# Patient Record
Sex: Female | Born: 1945 | Race: White | Hispanic: No | Smoking: Never smoker
Health system: Southern US, Community
[De-identification: ages and names within clinical notes are randomized; demographics above are authoritative.]

## PROBLEM LIST (undated history)

## (undated) DIAGNOSIS — E079 Disorder of thyroid, unspecified: Secondary | ICD-10-CM

## (undated) DIAGNOSIS — K8020 Calculus of gallbladder without cholecystitis without obstruction: Secondary | ICD-10-CM

## (undated) DIAGNOSIS — H409 Unspecified glaucoma: Secondary | ICD-10-CM

## (undated) HISTORY — PX: HYSTEROTOMY: SHX1776

## (undated) HISTORY — DX: Calculus of gallbladder without cholecystitis without obstruction: K80.20

## (undated) HISTORY — PX: HERNIA REPAIR: SHX51

## (undated) HISTORY — PX: CHOLECYSTECTOMY: SHX55

## (undated) HISTORY — DX: Unspecified glaucoma: H40.9

## (undated) HISTORY — DX: Disorder of thyroid, unspecified: E07.9

---

## 1976-06-15 DIAGNOSIS — N200 Calculus of kidney: Secondary | ICD-10-CM

## 1976-06-15 HISTORY — DX: Calculus of kidney: N20.0

## 2005-06-15 DIAGNOSIS — C4490 Unspecified malignant neoplasm of skin, unspecified: Secondary | ICD-10-CM

## 2005-06-15 HISTORY — DX: Unspecified malignant neoplasm of skin, unspecified: C44.90

## 2014-08-30 DIAGNOSIS — F070 Personality change due to known physiological condition: Secondary | ICD-10-CM | POA: Diagnosis not present

## 2014-08-30 DIAGNOSIS — E785 Hyperlipidemia, unspecified: Secondary | ICD-10-CM | POA: Diagnosis not present

## 2014-08-30 DIAGNOSIS — K432 Incisional hernia without obstruction or gangrene: Secondary | ICD-10-CM | POA: Diagnosis not present

## 2014-08-30 DIAGNOSIS — E039 Hypothyroidism, unspecified: Secondary | ICD-10-CM | POA: Diagnosis not present

## 2014-08-30 DIAGNOSIS — H9190 Unspecified hearing loss, unspecified ear: Secondary | ICD-10-CM | POA: Diagnosis not present

## 2014-08-30 DIAGNOSIS — Z89119 Acquired absence of unspecified hand: Secondary | ICD-10-CM | POA: Diagnosis not present

## 2014-09-05 DIAGNOSIS — K5730 Diverticulosis of large intestine without perforation or abscess without bleeding: Secondary | ICD-10-CM | POA: Diagnosis not present

## 2014-09-05 DIAGNOSIS — Z86010 Personal history of colonic polyps: Secondary | ICD-10-CM | POA: Diagnosis not present

## 2014-09-05 DIAGNOSIS — D125 Benign neoplasm of sigmoid colon: Secondary | ICD-10-CM | POA: Diagnosis not present

## 2014-09-05 DIAGNOSIS — K5790 Diverticulosis of intestine, part unspecified, without perforation or abscess without bleeding: Secondary | ICD-10-CM | POA: Diagnosis not present

## 2014-09-24 DIAGNOSIS — Z85828 Personal history of other malignant neoplasm of skin: Secondary | ICD-10-CM | POA: Diagnosis not present

## 2014-09-24 DIAGNOSIS — Z08 Encounter for follow-up examination after completed treatment for malignant neoplasm: Secondary | ICD-10-CM | POA: Diagnosis not present

## 2014-09-24 DIAGNOSIS — L821 Other seborrheic keratosis: Secondary | ICD-10-CM | POA: Diagnosis not present

## 2014-09-24 DIAGNOSIS — D225 Melanocytic nevi of trunk: Secondary | ICD-10-CM | POA: Diagnosis not present

## 2014-10-15 DIAGNOSIS — K432 Incisional hernia without obstruction or gangrene: Secondary | ICD-10-CM | POA: Diagnosis not present

## 2014-10-15 DIAGNOSIS — E039 Hypothyroidism, unspecified: Secondary | ICD-10-CM | POA: Diagnosis not present

## 2014-10-15 DIAGNOSIS — E785 Hyperlipidemia, unspecified: Secondary | ICD-10-CM | POA: Diagnosis not present

## 2014-10-15 DIAGNOSIS — F070 Personality change due to known physiological condition: Secondary | ICD-10-CM | POA: Diagnosis not present

## 2014-10-15 DIAGNOSIS — Z89119 Acquired absence of unspecified hand: Secondary | ICD-10-CM | POA: Diagnosis not present

## 2014-10-15 DIAGNOSIS — H9190 Unspecified hearing loss, unspecified ear: Secondary | ICD-10-CM | POA: Diagnosis not present

## 2014-12-19 DIAGNOSIS — E785 Hyperlipidemia, unspecified: Secondary | ICD-10-CM | POA: Diagnosis not present

## 2014-12-19 DIAGNOSIS — H9190 Unspecified hearing loss, unspecified ear: Secondary | ICD-10-CM | POA: Diagnosis not present

## 2014-12-19 DIAGNOSIS — K432 Incisional hernia without obstruction or gangrene: Secondary | ICD-10-CM | POA: Diagnosis not present

## 2014-12-19 DIAGNOSIS — F070 Personality change due to known physiological condition: Secondary | ICD-10-CM | POA: Diagnosis not present

## 2014-12-19 DIAGNOSIS — B070 Plantar wart: Secondary | ICD-10-CM | POA: Diagnosis not present

## 2014-12-19 DIAGNOSIS — E039 Hypothyroidism, unspecified: Secondary | ICD-10-CM | POA: Diagnosis not present

## 2014-12-19 DIAGNOSIS — Z89119 Acquired absence of unspecified hand: Secondary | ICD-10-CM | POA: Diagnosis not present

## 2015-03-29 DIAGNOSIS — L989 Disorder of the skin and subcutaneous tissue, unspecified: Secondary | ICD-10-CM | POA: Diagnosis not present

## 2015-05-27 DIAGNOSIS — M7751 Other enthesopathy of right foot: Secondary | ICD-10-CM | POA: Diagnosis not present

## 2015-05-27 DIAGNOSIS — L989 Disorder of the skin and subcutaneous tissue, unspecified: Secondary | ICD-10-CM | POA: Diagnosis not present

## 2015-05-27 DIAGNOSIS — M7741 Metatarsalgia, right foot: Secondary | ICD-10-CM | POA: Diagnosis not present

## 2015-06-19 DIAGNOSIS — M7751 Other enthesopathy of right foot: Secondary | ICD-10-CM | POA: Diagnosis not present

## 2015-06-19 DIAGNOSIS — M7741 Metatarsalgia, right foot: Secondary | ICD-10-CM | POA: Diagnosis not present

## 2015-06-19 DIAGNOSIS — L989 Disorder of the skin and subcutaneous tissue, unspecified: Secondary | ICD-10-CM | POA: Diagnosis not present

## 2015-07-01 DIAGNOSIS — Z89119 Acquired absence of unspecified hand: Secondary | ICD-10-CM | POA: Diagnosis not present

## 2015-07-01 DIAGNOSIS — B070 Plantar wart: Secondary | ICD-10-CM | POA: Diagnosis not present

## 2015-07-01 DIAGNOSIS — H9190 Unspecified hearing loss, unspecified ear: Secondary | ICD-10-CM | POA: Diagnosis not present

## 2015-07-01 DIAGNOSIS — E785 Hyperlipidemia, unspecified: Secondary | ICD-10-CM | POA: Diagnosis not present

## 2015-07-01 DIAGNOSIS — K432 Incisional hernia without obstruction or gangrene: Secondary | ICD-10-CM | POA: Diagnosis not present

## 2015-07-01 DIAGNOSIS — E039 Hypothyroidism, unspecified: Secondary | ICD-10-CM | POA: Diagnosis not present

## 2015-07-01 DIAGNOSIS — E559 Vitamin D deficiency, unspecified: Secondary | ICD-10-CM | POA: Diagnosis not present

## 2015-09-04 DIAGNOSIS — Z89119 Acquired absence of unspecified hand: Secondary | ICD-10-CM | POA: Diagnosis not present

## 2015-09-04 DIAGNOSIS — K432 Incisional hernia without obstruction or gangrene: Secondary | ICD-10-CM | POA: Diagnosis not present

## 2015-09-04 DIAGNOSIS — E559 Vitamin D deficiency, unspecified: Secondary | ICD-10-CM | POA: Diagnosis not present

## 2015-09-04 DIAGNOSIS — H9190 Unspecified hearing loss, unspecified ear: Secondary | ICD-10-CM | POA: Diagnosis not present

## 2015-09-04 DIAGNOSIS — E039 Hypothyroidism, unspecified: Secondary | ICD-10-CM | POA: Diagnosis not present

## 2015-09-04 DIAGNOSIS — E785 Hyperlipidemia, unspecified: Secondary | ICD-10-CM | POA: Diagnosis not present

## 2015-09-04 DIAGNOSIS — B070 Plantar wart: Secondary | ICD-10-CM | POA: Diagnosis not present

## 2015-12-25 DIAGNOSIS — M795 Residual foreign body in soft tissue: Secondary | ICD-10-CM | POA: Diagnosis not present

## 2016-01-08 DIAGNOSIS — H2513 Age-related nuclear cataract, bilateral: Secondary | ICD-10-CM | POA: Diagnosis not present

## 2016-01-08 DIAGNOSIS — H40053 Ocular hypertension, bilateral: Secondary | ICD-10-CM | POA: Diagnosis not present

## 2016-01-27 DIAGNOSIS — H40053 Ocular hypertension, bilateral: Secondary | ICD-10-CM | POA: Diagnosis not present

## 2016-04-01 DIAGNOSIS — E785 Hyperlipidemia, unspecified: Secondary | ICD-10-CM | POA: Diagnosis not present

## 2016-04-01 DIAGNOSIS — L989 Disorder of the skin and subcutaneous tissue, unspecified: Secondary | ICD-10-CM | POA: Diagnosis not present

## 2016-04-01 DIAGNOSIS — Z79899 Other long term (current) drug therapy: Secondary | ICD-10-CM | POA: Diagnosis not present

## 2016-04-01 DIAGNOSIS — E039 Hypothyroidism, unspecified: Secondary | ICD-10-CM | POA: Diagnosis not present

## 2016-04-01 DIAGNOSIS — J40 Bronchitis, not specified as acute or chronic: Secondary | ICD-10-CM | POA: Diagnosis not present

## 2016-04-13 DIAGNOSIS — Z1231 Encounter for screening mammogram for malignant neoplasm of breast: Secondary | ICD-10-CM | POA: Diagnosis not present

## 2016-04-13 DIAGNOSIS — R921 Mammographic calcification found on diagnostic imaging of breast: Secondary | ICD-10-CM | POA: Diagnosis not present

## 2016-04-20 DIAGNOSIS — Z0000 Encounter for general adult medical examination without abnormal findings: Secondary | ICD-10-CM | POA: Diagnosis not present

## 2016-04-20 DIAGNOSIS — J209 Acute bronchitis, unspecified: Secondary | ICD-10-CM | POA: Diagnosis not present

## 2016-04-25 DIAGNOSIS — R05 Cough: Secondary | ICD-10-CM | POA: Diagnosis not present

## 2016-04-25 DIAGNOSIS — R0600 Dyspnea, unspecified: Secondary | ICD-10-CM | POA: Diagnosis not present

## 2016-05-11 DIAGNOSIS — J40 Bronchitis, not specified as acute or chronic: Secondary | ICD-10-CM | POA: Diagnosis not present

## 2016-05-11 DIAGNOSIS — Z23 Encounter for immunization: Secondary | ICD-10-CM | POA: Diagnosis not present

## 2016-05-11 DIAGNOSIS — R05 Cough: Secondary | ICD-10-CM | POA: Diagnosis not present

## 2016-08-26 DIAGNOSIS — E785 Hyperlipidemia, unspecified: Secondary | ICD-10-CM | POA: Diagnosis not present

## 2016-08-26 DIAGNOSIS — J40 Bronchitis, not specified as acute or chronic: Secondary | ICD-10-CM | POA: Diagnosis not present

## 2016-08-26 DIAGNOSIS — E039 Hypothyroidism, unspecified: Secondary | ICD-10-CM | POA: Diagnosis not present

## 2016-09-06 DIAGNOSIS — R062 Wheezing: Secondary | ICD-10-CM | POA: Diagnosis not present

## 2016-09-06 DIAGNOSIS — J209 Acute bronchitis, unspecified: Secondary | ICD-10-CM | POA: Diagnosis not present

## 2017-01-11 DIAGNOSIS — H40012 Open angle with borderline findings, low risk, left eye: Secondary | ICD-10-CM | POA: Diagnosis not present

## 2017-01-11 DIAGNOSIS — H40011 Open angle with borderline findings, low risk, right eye: Secondary | ICD-10-CM | POA: Diagnosis not present

## 2017-01-11 DIAGNOSIS — H25813 Combined forms of age-related cataract, bilateral: Secondary | ICD-10-CM | POA: Diagnosis not present

## 2017-03-08 DIAGNOSIS — Z79899 Other long term (current) drug therapy: Secondary | ICD-10-CM | POA: Diagnosis not present

## 2017-03-08 DIAGNOSIS — E039 Hypothyroidism, unspecified: Secondary | ICD-10-CM | POA: Diagnosis not present

## 2017-03-08 DIAGNOSIS — Z23 Encounter for immunization: Secondary | ICD-10-CM | POA: Diagnosis not present

## 2017-03-08 DIAGNOSIS — E785 Hyperlipidemia, unspecified: Secondary | ICD-10-CM | POA: Diagnosis not present

## 2017-03-08 DIAGNOSIS — Z6825 Body mass index (BMI) 25.0-25.9, adult: Secondary | ICD-10-CM | POA: Diagnosis not present

## 2017-03-10 DIAGNOSIS — H40011 Open angle with borderline findings, low risk, right eye: Secondary | ICD-10-CM | POA: Diagnosis not present

## 2017-03-10 DIAGNOSIS — H40012 Open angle with borderline findings, low risk, left eye: Secondary | ICD-10-CM | POA: Diagnosis not present

## 2017-03-22 DIAGNOSIS — J0100 Acute maxillary sinusitis, unspecified: Secondary | ICD-10-CM | POA: Diagnosis not present

## 2017-03-22 DIAGNOSIS — J209 Acute bronchitis, unspecified: Secondary | ICD-10-CM | POA: Diagnosis not present

## 2017-04-01 DIAGNOSIS — H401131 Primary open-angle glaucoma, bilateral, mild stage: Secondary | ICD-10-CM | POA: Diagnosis not present

## 2017-05-17 DIAGNOSIS — R0602 Shortness of breath: Secondary | ICD-10-CM | POA: Diagnosis not present

## 2017-05-17 DIAGNOSIS — R05 Cough: Secondary | ICD-10-CM | POA: Diagnosis not present

## 2017-05-17 DIAGNOSIS — R0600 Dyspnea, unspecified: Secondary | ICD-10-CM | POA: Diagnosis not present

## 2017-05-18 DIAGNOSIS — Z1231 Encounter for screening mammogram for malignant neoplasm of breast: Secondary | ICD-10-CM | POA: Diagnosis not present

## 2017-07-02 DIAGNOSIS — H401131 Primary open-angle glaucoma, bilateral, mild stage: Secondary | ICD-10-CM | POA: Diagnosis not present

## 2017-08-01 DIAGNOSIS — J209 Acute bronchitis, unspecified: Secondary | ICD-10-CM | POA: Diagnosis not present

## 2017-08-24 DIAGNOSIS — J40 Bronchitis, not specified as acute or chronic: Secondary | ICD-10-CM | POA: Diagnosis not present

## 2017-08-24 DIAGNOSIS — E039 Hypothyroidism, unspecified: Secondary | ICD-10-CM | POA: Diagnosis not present

## 2017-08-24 DIAGNOSIS — Z0000 Encounter for general adult medical examination without abnormal findings: Secondary | ICD-10-CM | POA: Diagnosis not present

## 2017-08-24 DIAGNOSIS — E785 Hyperlipidemia, unspecified: Secondary | ICD-10-CM | POA: Diagnosis not present

## 2017-08-24 DIAGNOSIS — Z79899 Other long term (current) drug therapy: Secondary | ICD-10-CM | POA: Diagnosis not present

## 2017-11-26 DIAGNOSIS — J309 Allergic rhinitis, unspecified: Secondary | ICD-10-CM | POA: Diagnosis not present

## 2017-11-26 DIAGNOSIS — R062 Wheezing: Secondary | ICD-10-CM | POA: Diagnosis not present

## 2017-11-26 DIAGNOSIS — J206 Acute bronchitis due to rhinovirus: Secondary | ICD-10-CM | POA: Diagnosis not present

## 2018-03-10 DIAGNOSIS — E785 Hyperlipidemia, unspecified: Secondary | ICD-10-CM | POA: Diagnosis not present

## 2018-03-10 DIAGNOSIS — Z6825 Body mass index (BMI) 25.0-25.9, adult: Secondary | ICD-10-CM | POA: Diagnosis not present

## 2018-03-10 DIAGNOSIS — E039 Hypothyroidism, unspecified: Secondary | ICD-10-CM | POA: Diagnosis not present

## 2018-03-10 DIAGNOSIS — Z79899 Other long term (current) drug therapy: Secondary | ICD-10-CM | POA: Diagnosis not present

## 2018-03-10 DIAGNOSIS — Z23 Encounter for immunization: Secondary | ICD-10-CM | POA: Diagnosis not present

## 2018-03-29 DIAGNOSIS — J209 Acute bronchitis, unspecified: Secondary | ICD-10-CM | POA: Diagnosis not present

## 2018-03-29 DIAGNOSIS — J309 Allergic rhinitis, unspecified: Secondary | ICD-10-CM | POA: Diagnosis not present

## 2018-05-17 DIAGNOSIS — L821 Other seborrheic keratosis: Secondary | ICD-10-CM | POA: Diagnosis not present

## 2018-05-17 DIAGNOSIS — L570 Actinic keratosis: Secondary | ICD-10-CM | POA: Diagnosis not present

## 2018-06-17 DIAGNOSIS — J209 Acute bronchitis, unspecified: Secondary | ICD-10-CM | POA: Diagnosis not present

## 2018-06-17 DIAGNOSIS — J324 Chronic pansinusitis: Secondary | ICD-10-CM | POA: Diagnosis not present

## 2018-07-11 DIAGNOSIS — Z1231 Encounter for screening mammogram for malignant neoplasm of breast: Secondary | ICD-10-CM | POA: Diagnosis not present

## 2018-07-20 DIAGNOSIS — L820 Inflamed seborrheic keratosis: Secondary | ICD-10-CM | POA: Diagnosis not present

## 2018-07-20 DIAGNOSIS — L570 Actinic keratosis: Secondary | ICD-10-CM | POA: Diagnosis not present

## 2018-09-21 DIAGNOSIS — J309 Allergic rhinitis, unspecified: Secondary | ICD-10-CM | POA: Diagnosis not present

## 2018-09-21 DIAGNOSIS — J449 Chronic obstructive pulmonary disease, unspecified: Secondary | ICD-10-CM | POA: Diagnosis not present

## 2018-12-13 DIAGNOSIS — J189 Pneumonia, unspecified organism: Secondary | ICD-10-CM | POA: Diagnosis not present

## 2018-12-13 DIAGNOSIS — J449 Chronic obstructive pulmonary disease, unspecified: Secondary | ICD-10-CM | POA: Diagnosis not present

## 2018-12-13 DIAGNOSIS — R9431 Abnormal electrocardiogram [ECG] [EKG]: Secondary | ICD-10-CM | POA: Diagnosis not present

## 2018-12-26 DIAGNOSIS — J449 Chronic obstructive pulmonary disease, unspecified: Secondary | ICD-10-CM | POA: Diagnosis not present

## 2018-12-26 DIAGNOSIS — J209 Acute bronchitis, unspecified: Secondary | ICD-10-CM | POA: Diagnosis not present

## 2018-12-26 DIAGNOSIS — R0602 Shortness of breath: Secondary | ICD-10-CM | POA: Diagnosis not present

## 2019-01-12 ENCOUNTER — Other Ambulatory Visit: Payer: Self-pay

## 2019-03-06 DIAGNOSIS — K449 Diaphragmatic hernia without obstruction or gangrene: Secondary | ICD-10-CM | POA: Diagnosis not present

## 2019-03-06 DIAGNOSIS — R319 Hematuria, unspecified: Secondary | ICD-10-CM | POA: Diagnosis not present

## 2019-03-06 DIAGNOSIS — K5732 Diverticulitis of large intestine without perforation or abscess without bleeding: Secondary | ICD-10-CM | POA: Diagnosis not present

## 2019-03-06 DIAGNOSIS — R1032 Left lower quadrant pain: Secondary | ICD-10-CM | POA: Diagnosis not present

## 2019-03-06 DIAGNOSIS — R1084 Generalized abdominal pain: Secondary | ICD-10-CM | POA: Diagnosis not present

## 2019-03-13 DIAGNOSIS — E785 Hyperlipidemia, unspecified: Secondary | ICD-10-CM | POA: Diagnosis not present

## 2019-03-13 DIAGNOSIS — Z6825 Body mass index (BMI) 25.0-25.9, adult: Secondary | ICD-10-CM | POA: Diagnosis not present

## 2019-03-13 DIAGNOSIS — E039 Hypothyroidism, unspecified: Secondary | ICD-10-CM | POA: Diagnosis not present

## 2019-03-13 DIAGNOSIS — Z79899 Other long term (current) drug therapy: Secondary | ICD-10-CM | POA: Diagnosis not present

## 2019-03-13 DIAGNOSIS — Z23 Encounter for immunization: Secondary | ICD-10-CM | POA: Diagnosis not present

## 2019-03-13 DIAGNOSIS — Z0000 Encounter for general adult medical examination without abnormal findings: Secondary | ICD-10-CM | POA: Diagnosis not present

## 2019-03-21 DIAGNOSIS — H25812 Combined forms of age-related cataract, left eye: Secondary | ICD-10-CM | POA: Diagnosis not present

## 2019-03-21 DIAGNOSIS — Z01818 Encounter for other preprocedural examination: Secondary | ICD-10-CM | POA: Diagnosis not present

## 2019-03-21 DIAGNOSIS — H401132 Primary open-angle glaucoma, bilateral, moderate stage: Secondary | ICD-10-CM | POA: Diagnosis not present

## 2019-03-24 DIAGNOSIS — H401132 Primary open-angle glaucoma, bilateral, moderate stage: Secondary | ICD-10-CM | POA: Diagnosis not present

## 2019-04-11 DIAGNOSIS — H401122 Primary open-angle glaucoma, left eye, moderate stage: Secondary | ICD-10-CM | POA: Diagnosis not present

## 2019-04-11 DIAGNOSIS — Z79899 Other long term (current) drug therapy: Secondary | ICD-10-CM | POA: Diagnosis not present

## 2019-04-11 DIAGNOSIS — H401120 Primary open-angle glaucoma, left eye, stage unspecified: Secondary | ICD-10-CM | POA: Diagnosis not present

## 2019-04-11 DIAGNOSIS — H2512 Age-related nuclear cataract, left eye: Secondary | ICD-10-CM | POA: Diagnosis not present

## 2019-04-11 DIAGNOSIS — Z87891 Personal history of nicotine dependence: Secondary | ICD-10-CM | POA: Diagnosis not present

## 2019-04-11 DIAGNOSIS — H25812 Combined forms of age-related cataract, left eye: Secondary | ICD-10-CM | POA: Diagnosis not present

## 2019-04-25 DIAGNOSIS — E039 Hypothyroidism, unspecified: Secondary | ICD-10-CM | POA: Diagnosis not present

## 2019-04-25 DIAGNOSIS — H25811 Combined forms of age-related cataract, right eye: Secondary | ICD-10-CM | POA: Diagnosis not present

## 2019-04-25 DIAGNOSIS — H401112 Primary open-angle glaucoma, right eye, moderate stage: Secondary | ICD-10-CM | POA: Diagnosis not present

## 2019-04-25 DIAGNOSIS — H401110 Primary open-angle glaucoma, right eye, stage unspecified: Secondary | ICD-10-CM | POA: Diagnosis not present

## 2019-04-25 DIAGNOSIS — E785 Hyperlipidemia, unspecified: Secondary | ICD-10-CM | POA: Diagnosis not present

## 2019-04-25 DIAGNOSIS — Z87891 Personal history of nicotine dependence: Secondary | ICD-10-CM | POA: Diagnosis not present

## 2019-04-25 DIAGNOSIS — H2511 Age-related nuclear cataract, right eye: Secondary | ICD-10-CM | POA: Diagnosis not present

## 2019-06-29 DIAGNOSIS — J069 Acute upper respiratory infection, unspecified: Secondary | ICD-10-CM | POA: Diagnosis not present

## 2019-07-19 DIAGNOSIS — T148XXA Other injury of unspecified body region, initial encounter: Secondary | ICD-10-CM | POA: Diagnosis not present

## 2019-07-19 DIAGNOSIS — Z6826 Body mass index (BMI) 26.0-26.9, adult: Secondary | ICD-10-CM | POA: Diagnosis not present

## 2019-07-24 DIAGNOSIS — B029 Zoster without complications: Secondary | ICD-10-CM | POA: Diagnosis not present

## 2019-07-26 DIAGNOSIS — H1012 Acute atopic conjunctivitis, left eye: Secondary | ICD-10-CM | POA: Diagnosis not present

## 2019-07-31 DIAGNOSIS — B0229 Other postherpetic nervous system involvement: Secondary | ICD-10-CM | POA: Diagnosis not present

## 2019-07-31 DIAGNOSIS — Z6826 Body mass index (BMI) 26.0-26.9, adult: Secondary | ICD-10-CM | POA: Diagnosis not present

## 2019-07-31 DIAGNOSIS — Z1331 Encounter for screening for depression: Secondary | ICD-10-CM | POA: Diagnosis not present

## 2019-08-18 DIAGNOSIS — H401131 Primary open-angle glaucoma, bilateral, mild stage: Secondary | ICD-10-CM | POA: Diagnosis not present

## 2019-08-28 DIAGNOSIS — J180 Bronchopneumonia, unspecified organism: Secondary | ICD-10-CM | POA: Diagnosis not present

## 2019-09-01 DIAGNOSIS — Z1231 Encounter for screening mammogram for malignant neoplasm of breast: Secondary | ICD-10-CM | POA: Diagnosis not present

## 2019-09-14 DIAGNOSIS — Z23 Encounter for immunization: Secondary | ICD-10-CM | POA: Diagnosis not present

## 2019-10-12 DIAGNOSIS — Z23 Encounter for immunization: Secondary | ICD-10-CM | POA: Diagnosis not present

## 2019-10-28 DIAGNOSIS — R05 Cough: Secondary | ICD-10-CM | POA: Diagnosis not present

## 2019-10-28 DIAGNOSIS — R062 Wheezing: Secondary | ICD-10-CM | POA: Diagnosis not present

## 2020-01-10 DIAGNOSIS — R05 Cough: Secondary | ICD-10-CM | POA: Diagnosis not present

## 2020-01-10 DIAGNOSIS — R0600 Dyspnea, unspecified: Secondary | ICD-10-CM | POA: Diagnosis not present

## 2020-01-10 DIAGNOSIS — R062 Wheezing: Secondary | ICD-10-CM | POA: Diagnosis not present

## 2020-03-15 DIAGNOSIS — E785 Hyperlipidemia, unspecified: Secondary | ICD-10-CM | POA: Diagnosis not present

## 2020-03-15 DIAGNOSIS — E039 Hypothyroidism, unspecified: Secondary | ICD-10-CM | POA: Diagnosis not present

## 2020-03-15 DIAGNOSIS — M8580 Other specified disorders of bone density and structure, unspecified site: Secondary | ICD-10-CM | POA: Diagnosis not present

## 2020-03-15 DIAGNOSIS — Z0000 Encounter for general adult medical examination without abnormal findings: Secondary | ICD-10-CM | POA: Diagnosis not present

## 2020-03-15 DIAGNOSIS — Z6826 Body mass index (BMI) 26.0-26.9, adult: Secondary | ICD-10-CM | POA: Diagnosis not present

## 2020-03-15 DIAGNOSIS — Z23 Encounter for immunization: Secondary | ICD-10-CM | POA: Diagnosis not present

## 2020-03-15 DIAGNOSIS — Z79899 Other long term (current) drug therapy: Secondary | ICD-10-CM | POA: Diagnosis not present

## 2020-04-16 DIAGNOSIS — J069 Acute upper respiratory infection, unspecified: Secondary | ICD-10-CM | POA: Diagnosis not present

## 2020-04-16 DIAGNOSIS — R051 Acute cough: Secondary | ICD-10-CM | POA: Diagnosis not present

## 2020-04-26 DIAGNOSIS — H401131 Primary open-angle glaucoma, bilateral, mild stage: Secondary | ICD-10-CM | POA: Diagnosis not present

## 2020-04-26 DIAGNOSIS — H18413 Arcus senilis, bilateral: Secondary | ICD-10-CM | POA: Diagnosis not present

## 2020-04-26 DIAGNOSIS — H35373 Puckering of macula, bilateral: Secondary | ICD-10-CM | POA: Diagnosis not present

## 2020-04-26 DIAGNOSIS — H02831 Dermatochalasis of right upper eyelid: Secondary | ICD-10-CM | POA: Diagnosis not present

## 2020-05-21 DIAGNOSIS — J40 Bronchitis, not specified as acute or chronic: Secondary | ICD-10-CM | POA: Diagnosis not present

## 2020-05-28 DIAGNOSIS — Z23 Encounter for immunization: Secondary | ICD-10-CM | POA: Diagnosis not present

## 2020-07-22 DIAGNOSIS — J40 Bronchitis, not specified as acute or chronic: Secondary | ICD-10-CM | POA: Diagnosis not present

## 2020-09-09 DIAGNOSIS — J42 Unspecified chronic bronchitis: Secondary | ICD-10-CM | POA: Diagnosis not present

## 2020-09-17 DIAGNOSIS — Z1231 Encounter for screening mammogram for malignant neoplasm of breast: Secondary | ICD-10-CM | POA: Diagnosis not present

## 2020-10-28 DIAGNOSIS — Z23 Encounter for immunization: Secondary | ICD-10-CM | POA: Diagnosis not present

## 2020-10-30 DIAGNOSIS — H401131 Primary open-angle glaucoma, bilateral, mild stage: Secondary | ICD-10-CM | POA: Diagnosis not present

## 2020-12-08 DIAGNOSIS — B021 Zoster meningitis: Secondary | ICD-10-CM | POA: Diagnosis not present

## 2020-12-10 DIAGNOSIS — K5790 Diverticulosis of intestine, part unspecified, without perforation or abscess without bleeding: Secondary | ICD-10-CM

## 2020-12-10 HISTORY — DX: Diverticulosis of intestine, part unspecified, without perforation or abscess without bleeding: K57.90

## 2020-12-11 DIAGNOSIS — N135 Crossing vessel and stricture of ureter without hydronephrosis: Secondary | ICD-10-CM | POA: Diagnosis not present

## 2020-12-11 DIAGNOSIS — R109 Unspecified abdominal pain: Secondary | ICD-10-CM | POA: Diagnosis not present

## 2020-12-12 DIAGNOSIS — Z6826 Body mass index (BMI) 26.0-26.9, adult: Secondary | ICD-10-CM | POA: Diagnosis not present

## 2020-12-12 DIAGNOSIS — N135 Crossing vessel and stricture of ureter without hydronephrosis: Secondary | ICD-10-CM | POA: Diagnosis not present

## 2020-12-19 ENCOUNTER — Other Ambulatory Visit (HOSPITAL_COMMUNITY): Payer: Self-pay | Admitting: Urology

## 2020-12-19 ENCOUNTER — Other Ambulatory Visit: Payer: Self-pay | Admitting: Urology

## 2020-12-19 DIAGNOSIS — N130 Hydronephrosis with ureteropelvic junction obstruction: Secondary | ICD-10-CM | POA: Diagnosis not present

## 2020-12-19 DIAGNOSIS — Q6211 Congenital occlusion of ureteropelvic junction: Secondary | ICD-10-CM | POA: Diagnosis not present

## 2021-01-02 ENCOUNTER — Ambulatory Visit (HOSPITAL_COMMUNITY)
Admission: RE | Admit: 2021-01-02 | Discharge: 2021-01-02 | Disposition: A | Discharge status: Home / Self Care | Payer: Medicare Other | Source: Ambulatory Visit | Attending: Urology | Admitting: Urology

## 2021-01-02 ENCOUNTER — Other Ambulatory Visit: Payer: Self-pay

## 2021-01-02 DIAGNOSIS — N130 Hydronephrosis with ureteropelvic junction obstruction: Secondary | ICD-10-CM

## 2021-01-02 DIAGNOSIS — Q6211 Congenital occlusion of ureteropelvic junction: Secondary | ICD-10-CM | POA: Diagnosis not present

## 2021-01-02 DIAGNOSIS — N1330 Unspecified hydronephrosis: Secondary | ICD-10-CM | POA: Diagnosis not present

## 2021-01-02 MED ORDER — FUROSEMIDE 10 MG/ML IJ SOLN: 33.0000 mg | Freq: Once | INTRAMUSCULAR | Status: AC

## 2021-01-02 MED ORDER — FUROSEMIDE 10 MG/ML IJ SOLN
INTRAMUSCULAR | Status: AC
  Administered 2021-01-02: 33 mg via INTRAVENOUS
  Filled 2021-01-02: qty 4

## 2021-01-02 MED ORDER — TECHNETIUM TC 99M MERTIATIDE
5.4000 | Freq: Once | INTRAVENOUS | Status: AC | PRN
  Administered 2021-01-02: 5.4 via INTRAVENOUS

## 2021-01-30 DIAGNOSIS — Q6211 Congenital occlusion of ureteropelvic junction: Secondary | ICD-10-CM | POA: Diagnosis not present

## 2021-02-01 DIAGNOSIS — M79604 Pain in right leg: Secondary | ICD-10-CM | POA: Diagnosis not present

## 2021-02-01 DIAGNOSIS — M7989 Other specified soft tissue disorders: Secondary | ICD-10-CM | POA: Diagnosis not present

## 2021-02-01 DIAGNOSIS — M79671 Pain in right foot: Secondary | ICD-10-CM | POA: Diagnosis not present

## 2021-02-01 DIAGNOSIS — M79661 Pain in right lower leg: Secondary | ICD-10-CM | POA: Diagnosis not present

## 2021-02-01 DIAGNOSIS — R600 Localized edema: Secondary | ICD-10-CM | POA: Diagnosis not present

## 2021-02-24 ENCOUNTER — Ambulatory Visit (INDEPENDENT_AMBULATORY_CARE_PROVIDER_SITE_OTHER): Payer: Medicare Other | Admitting: Podiatry

## 2021-02-24 ENCOUNTER — Ambulatory Visit (INDEPENDENT_AMBULATORY_CARE_PROVIDER_SITE_OTHER): Payer: Medicare Other

## 2021-02-24 ENCOUNTER — Other Ambulatory Visit: Payer: Self-pay

## 2021-02-24 DIAGNOSIS — M722 Plantar fascial fibromatosis: Secondary | ICD-10-CM | POA: Diagnosis not present

## 2021-02-24 DIAGNOSIS — M216X9 Other acquired deformities of unspecified foot: Secondary | ICD-10-CM | POA: Diagnosis not present

## 2021-02-24 DIAGNOSIS — M79671 Pain in right foot: Secondary | ICD-10-CM | POA: Diagnosis not present

## 2021-02-24 MED ORDER — BETAMETHASONE SOD PHOS & ACET 6 (3-3) MG/ML IJ SUSP
6.0000 mg | Freq: Once | INTRAMUSCULAR | Status: AC
  Administered 2021-02-24: 6 mg

## 2021-02-24 NOTE — Progress Notes (Signed)
  Subjective:  Patient ID: Jodi Huber, female    DOB: 10-Sep-1945,  MRN: [id number]  Chief Complaint  Patient presents with   Pain    Rt bottom heel pain radiates to back x 1 mo - no injury - pain varies - 6/10 sharp constant pain - no swelling - pt had [place] no blood clots - w/ burning feeling tx: elevate, epsom, icing and inerste   75 y.o. female presents with the above complaint. History confirmed with patient.   Objective:  Physical Exam: warm, good capillary refill, no trophic changes or ulcerative lesions, normal DP and PT pulses, and normal sensory exam. Right Foot: tenderness to palpation medial calcaneal tuber, no pain with calcaneal squeeze, decreased ankle joint ROM, and +Silverskiold test   Radiographs: X-ray of the right foot: no evidence of calcaneal stress fracture and plantar calcaneal spur, no Haglund deformity  Assessment:   1. Plantar fasciitis of right foot   2. Equinus deformity of foot   3. Pain of right heel    Plan:  Patient was evaluated and treated and all questions answered.  Plantar Fasciitis -XR reviewed with patient -Educated patient on stretching and icing of the affected limb -Plantar fascial brace dispensed -Heel pads dispensed x2 -Injection delivered to the plantar fascia of the right foot.  Procedure: Injection Tendon/Ligament Consent: Verbal consent obtained. Location: Right plantar fascia at the glabrous junction; medial approach. Skin Prep: Alcohol. Injectate: 1 cc 0.5% marcaine plain, 1 cc betamethasone acetate-betamethasone sodium phosphate Disposition: Patient tolerated procedure well. Injection site dressed with a band-aid.  Return in about 4 weeks (around 03/24/2021) for Plantar fasciitis.

## 2021-02-24 NOTE — Patient Instructions (Signed)

## 2021-03-17 DIAGNOSIS — E785 Hyperlipidemia, unspecified: Secondary | ICD-10-CM | POA: Diagnosis not present

## 2021-03-17 DIAGNOSIS — Z1331 Encounter for screening for depression: Secondary | ICD-10-CM | POA: Diagnosis not present

## 2021-03-17 DIAGNOSIS — Z6827 Body mass index (BMI) 27.0-27.9, adult: Secondary | ICD-10-CM | POA: Diagnosis not present

## 2021-03-17 DIAGNOSIS — Z79899 Other long term (current) drug therapy: Secondary | ICD-10-CM | POA: Diagnosis not present

## 2021-03-17 DIAGNOSIS — Z23 Encounter for immunization: Secondary | ICD-10-CM | POA: Diagnosis not present

## 2021-03-17 DIAGNOSIS — E039 Hypothyroidism, unspecified: Secondary | ICD-10-CM | POA: Diagnosis not present

## 2021-03-17 DIAGNOSIS — Z0000 Encounter for general adult medical examination without abnormal findings: Secondary | ICD-10-CM | POA: Diagnosis not present

## 2021-03-27 ENCOUNTER — Ambulatory Visit: Payer: Medicare Other | Admitting: Podiatry

## 2021-04-23 DIAGNOSIS — K649 Unspecified hemorrhoids: Secondary | ICD-10-CM | POA: Diagnosis not present

## 2021-05-30 DIAGNOSIS — Z86010 Personal history of colonic polyps: Secondary | ICD-10-CM | POA: Diagnosis not present

## 2021-05-30 DIAGNOSIS — K648 Other hemorrhoids: Secondary | ICD-10-CM | POA: Diagnosis not present

## 2021-05-30 DIAGNOSIS — Z1211 Encounter for screening for malignant neoplasm of colon: Secondary | ICD-10-CM | POA: Diagnosis not present

## 2021-05-30 DIAGNOSIS — K644 Residual hemorrhoidal skin tags: Secondary | ICD-10-CM | POA: Diagnosis not present

## 2021-05-30 DIAGNOSIS — Z85828 Personal history of other malignant neoplasm of skin: Secondary | ICD-10-CM | POA: Diagnosis not present

## 2021-05-30 DIAGNOSIS — K5730 Diverticulosis of large intestine without perforation or abscess without bleeding: Secondary | ICD-10-CM | POA: Diagnosis not present

## 2021-05-30 DIAGNOSIS — Z09 Encounter for follow-up examination after completed treatment for conditions other than malignant neoplasm: Secondary | ICD-10-CM | POA: Diagnosis not present

## 2021-05-30 DIAGNOSIS — K5750 Diverticulosis of both small and large intestine without perforation or abscess without bleeding: Secondary | ICD-10-CM | POA: Diagnosis not present

## 2021-10-10 DIAGNOSIS — Z1231 Encounter for screening mammogram for malignant neoplasm of breast: Secondary | ICD-10-CM | POA: Diagnosis not present

## 2021-10-29 DIAGNOSIS — M7751 Other enthesopathy of right foot: Secondary | ICD-10-CM | POA: Diagnosis not present

## 2021-10-29 DIAGNOSIS — M722 Plantar fascial fibromatosis: Secondary | ICD-10-CM | POA: Diagnosis not present

## 2021-10-29 DIAGNOSIS — M79671 Pain in right foot: Secondary | ICD-10-CM | POA: Diagnosis not present

## 2021-10-29 DIAGNOSIS — H401131 Primary open-angle glaucoma, bilateral, mild stage: Secondary | ICD-10-CM | POA: Diagnosis not present

## 2021-12-10 DIAGNOSIS — K6389 Other specified diseases of intestine: Secondary | ICD-10-CM | POA: Diagnosis not present

## 2021-12-10 DIAGNOSIS — K5732 Diverticulitis of large intestine without perforation or abscess without bleeding: Secondary | ICD-10-CM | POA: Diagnosis present

## 2021-12-10 DIAGNOSIS — I252 Old myocardial infarction: Secondary | ICD-10-CM | POA: Diagnosis not present

## 2021-12-10 DIAGNOSIS — Z885 Allergy status to narcotic agent status: Secondary | ICD-10-CM | POA: Diagnosis not present

## 2021-12-10 DIAGNOSIS — E785 Hyperlipidemia, unspecified: Secondary | ICD-10-CM | POA: Diagnosis present

## 2021-12-10 DIAGNOSIS — J45909 Unspecified asthma, uncomplicated: Secondary | ICD-10-CM | POA: Diagnosis present

## 2021-12-10 DIAGNOSIS — K7460 Unspecified cirrhosis of liver: Secondary | ICD-10-CM | POA: Diagnosis present

## 2021-12-10 DIAGNOSIS — E039 Hypothyroidism, unspecified: Secondary | ICD-10-CM | POA: Diagnosis present

## 2021-12-10 DIAGNOSIS — Z87442 Personal history of urinary calculi: Secondary | ICD-10-CM | POA: Diagnosis not present

## 2021-12-10 DIAGNOSIS — K5730 Diverticulosis of large intestine without perforation or abscess without bleeding: Secondary | ICD-10-CM | POA: Diagnosis not present

## 2021-12-10 DIAGNOSIS — R1032 Left lower quadrant pain: Secondary | ICD-10-CM | POA: Diagnosis not present

## 2021-12-19 DIAGNOSIS — K7460 Unspecified cirrhosis of liver: Secondary | ICD-10-CM | POA: Diagnosis not present

## 2021-12-19 DIAGNOSIS — Z09 Encounter for follow-up examination after completed treatment for conditions other than malignant neoplasm: Secondary | ICD-10-CM | POA: Diagnosis not present

## 2021-12-19 DIAGNOSIS — K5792 Diverticulitis of intestine, part unspecified, without perforation or abscess without bleeding: Secondary | ICD-10-CM | POA: Diagnosis not present

## 2021-12-19 DIAGNOSIS — Z0000 Encounter for general adult medical examination without abnormal findings: Secondary | ICD-10-CM | POA: Diagnosis not present

## 2022-01-04 DIAGNOSIS — K5732 Diverticulitis of large intestine without perforation or abscess without bleeding: Secondary | ICD-10-CM | POA: Diagnosis not present

## 2022-01-04 DIAGNOSIS — K5792 Diverticulitis of intestine, part unspecified, without perforation or abscess without bleeding: Secondary | ICD-10-CM | POA: Diagnosis not present

## 2022-01-07 DIAGNOSIS — Z6826 Body mass index (BMI) 26.0-26.9, adult: Secondary | ICD-10-CM | POA: Diagnosis not present

## 2022-01-07 DIAGNOSIS — K5792 Diverticulitis of intestine, part unspecified, without perforation or abscess without bleeding: Secondary | ICD-10-CM | POA: Diagnosis not present

## 2022-01-14 IMAGING — NM NM RENAL IMAGING FLOW W/ PHARM
2 series · 12 of 12 positions shown · non-contrast
Comparison: CT abdomen and pelvis 12/11/2020

CLINICAL DATA: RIGHT hydronephrosis with UPJ obstruction

EXAM:
NUCLEAR MEDICINE RENAL SCAN WITH DIURETIC ADMINISTRATION
TECHNIQUE: Radionuclide angiographic and sequential renal images were obtained
after intravenous injection of radiopharmaceutical. Imaging was
continued during slow intravenous injection of Lasix approximately
15 minutes after the start of the examination.
RADIOPHARMACEUTICALS:  5.4 mCi Lechnetium-RRm MAG3 IV
Pharmaceutical: Lasix 33 mg IV

[Series 1: renal scan · 4.14mm/px · 6 of 54 frames shown (1 of 2)]
[frame 5/54]
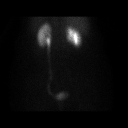
[frame 14/54]
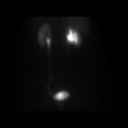
[frame 23/54]
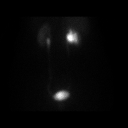
[frame 32/54]
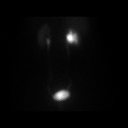
[frame 41/54]
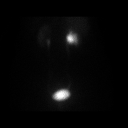
[frame 50/54]
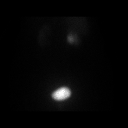

[Series 1: renal scan · 4.14mm/px · 6 of 40 frames shown (2 of 2)]
[frame 4/40  full-range]
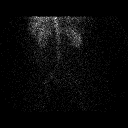
[frame 10/40  full-range]
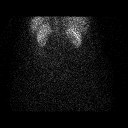
[frame 17/40  full-range]
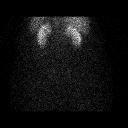
[frame 24/40  full-range]
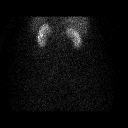
[frame 30/40  full-range]
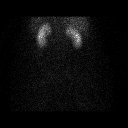
[frame 37/40  full-range]
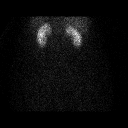

[12 of 12 positions shown; findings below may reference images not displayed]

FINDINGS: Flow:  Prompt symmetric arterial flow to the kidneys.

Left renogram: Normal uptake concentration, and excretion of tracer
by LEFT kidney. Good clearance of tracer before and continuing
following Lasix administration. No significant residual tracer at
conclusion of exam. Analysis of the renogram curve demonstrates a
normal time to peak activity of 3.2 minutes with fall to half
maximum activity 6.9 minutes later.

Right renogram: Normal uptake and concentration of tracer by RIGHT
kidney. Initial photopenic defect from dilated collecting system.
Excretion of tracer into the dilated collecting system with only
minimal clearance of tracer prior to Lasix. However following
diuretic administration there is good washout of tracer from the
RIGHT kidney, with minimal residual at the conclusion of the exam.
Analysis of the renogram curve demonstrates a delayed time to peak
activity of 10.2 minutes with fall to half maximum activity
minutes later.

Differential:

Left kidney = 53 %

Right kidney = 47 %

T1/2 post Lasix :

Left kidney = 12.6 min

Right kidney = 14.0 min
IMPRESSION: Dilated RIGHT renal collecting system.

However good washout of tracer from the RIGHT renal collecting
system is seen following diuretic administration indicating an
absence of significant urinary outflow obstruction.

Normal LEFT renogram.

Symmetric renal function.

## 2022-03-18 DIAGNOSIS — Z79899 Other long term (current) drug therapy: Secondary | ICD-10-CM | POA: Diagnosis not present

## 2022-03-18 DIAGNOSIS — E785 Hyperlipidemia, unspecified: Secondary | ICD-10-CM | POA: Diagnosis not present

## 2022-03-18 DIAGNOSIS — Z6826 Body mass index (BMI) 26.0-26.9, adult: Secondary | ICD-10-CM | POA: Diagnosis not present

## 2022-03-18 DIAGNOSIS — E039 Hypothyroidism, unspecified: Secondary | ICD-10-CM | POA: Diagnosis not present

## 2022-03-18 DIAGNOSIS — Z23 Encounter for immunization: Secondary | ICD-10-CM | POA: Diagnosis not present

## 2022-04-15 DEATH — deceased

## 2022-05-19 DIAGNOSIS — H401131 Primary open-angle glaucoma, bilateral, mild stage: Secondary | ICD-10-CM | POA: Diagnosis not present

## 2022-05-19 DIAGNOSIS — Z961 Presence of intraocular lens: Secondary | ICD-10-CM | POA: Diagnosis not present

## 2022-05-19 DIAGNOSIS — H524 Presbyopia: Secondary | ICD-10-CM | POA: Diagnosis not present

## 2022-05-25 ENCOUNTER — Encounter: Payer: Self-pay | Admitting: Family Medicine

## 2022-05-29 ENCOUNTER — Encounter: Payer: Self-pay | Admitting: Gastroenterology

## 2022-06-22 DIAGNOSIS — R051 Acute cough: Secondary | ICD-10-CM | POA: Diagnosis not present

## 2022-06-22 DIAGNOSIS — R0981 Nasal congestion: Secondary | ICD-10-CM | POA: Diagnosis not present

## 2022-06-22 DIAGNOSIS — J069 Acute upper respiratory infection, unspecified: Secondary | ICD-10-CM | POA: Diagnosis not present

## 2022-06-30 DIAGNOSIS — R059 Cough, unspecified: Secondary | ICD-10-CM | POA: Diagnosis not present

## 2022-07-02 ENCOUNTER — Ambulatory Visit (INDEPENDENT_AMBULATORY_CARE_PROVIDER_SITE_OTHER): Payer: Medicare Other | Admitting: Gastroenterology

## 2022-07-02 ENCOUNTER — Other Ambulatory Visit (INDEPENDENT_AMBULATORY_CARE_PROVIDER_SITE_OTHER): Payer: Medicare Other

## 2022-07-02 ENCOUNTER — Encounter: Payer: Self-pay | Admitting: Gastroenterology

## 2022-07-02 VITALS — BP 130/78 | HR 93 | Ht 61.5 in | Wt 137.2 lb

## 2022-07-02 DIAGNOSIS — Z8719 Personal history of other diseases of the digestive system: Secondary | ICD-10-CM | POA: Diagnosis not present

## 2022-07-02 DIAGNOSIS — R933 Abnormal findings on diagnostic imaging of other parts of digestive tract: Secondary | ICD-10-CM

## 2022-07-02 LAB — HEPATIC FUNCTION PANEL
ALT: 13 U/L (ref 0–35)
AST: 13 U/L (ref 0–37)
Albumin: 4 g/dL (ref 3.5–5.2)
Alkaline Phosphatase: 69 U/L (ref 39–117)
Bilirubin, Direct: 0.1 mg/dL (ref 0.0–0.3)
Total Bilirubin: 0.4 mg/dL (ref 0.2–1.2)
Total Protein: 7.7 g/dL (ref 6.0–8.3)

## 2022-07-02 NOTE — Progress Notes (Signed)
[facility] Consult Note:  History: Jodi Huber 07/02/2022  Referring provider: Ronita Hipps, MD  Reason for consult/chief complaint: Cirrhosis (Pt states she was referred by her PCP for fatty liver)   Subjective  HPI: [name] was referred to [place] last summer by primary care provider for cirrhosis reportedly discovered on ED workup shortly before that for diverticulitis.  Office note from PCP includes no details of the testing that led to that diagnosis.  [facility] ED provider note from 12/10/2021 indicates patient was there for several days of abdominal pain with a reported history of diverticulitis.  If any abdominal imaging was performed that day, it is not included in that ED note nor with any other records sent to [place] for this consult.  CBC normal and CMP normal except glucose 119 the date of that ED visit.  (Scanned referral records were reviewed) Epic encounter review indicates this patient may have seen in [employer] practice in late 2022 for hemorrhoids and diverticular disease, however those encounter notes/records cannot be accessed.  Jodi Huber says she is here at the request of primary care because she was told she had cirrhosis based on some testing.  She also believes someone suggest that she has fatty liver. As near as I can determine, this was likely all based on a CT scan of the abdomen at the time she was briefly hospitalized for sigmoid diverticulitis in June 2023.  She tells me she recovered from that episode and has had no further abdominal pain since then.  She has no personal or family history of liver disease to her knowledge.  She has never been a regular alcohol user, never had viral hepatitis or other apparent risk factors for cirrhosis.   ROS:  Review of Systems  Constitutional:  Negative for appetite change and unexpected weight change.  HENT:  Negative for mouth sores and voice change.   Eyes:  Negative for pain and redness.   Respiratory:  Negative for cough and shortness of breath.   Cardiovascular:  Negative for chest pain and palpitations.  Genitourinary:  Negative for dysuria and hematuria.  Musculoskeletal:  Negative for arthralgias and myalgias.  Skin:  Negative for pallor and rash.  Neurological:  Negative for weakness and headaches.  Hematological:  Negative for adenopathy.     Past Medical History: Past Medical History:  Diagnosis Date   Diverticulosis 12/10/2020   Gallstone    Glaucoma    Kidney stones 1978   Skin cancer 2007   Thyroid disease      Past Surgical History: Past Surgical History:  Procedure Laterality Date   CHOLECYSTECTOMY     HERNIA REPAIR     HYSTEROTOMY       Family History: No family history on file.  Social History: Social History   Socioeconomic History   Marital status: Divorced    Spouse name: Not on file   Number of children: Not on file   Years of education: Not on file   Highest education level: Not on file  Occupational History   Not on file  Tobacco Use   Smoking status: Never   Smokeless tobacco: Never  Vaping Use   Vaping Use: Never used  Substance and Sexual Activity   Alcohol use: Never   Drug use: Never   Sexual activity: Not on file  Other Topics Concern   Not on file  Social History Narrative   Not on file   Social Determinants of Health   Financial  Resource Strain: Not on file  Food Insecurity: Not on file  Transportation Needs: Not on file  Physical Activity: Not on file  Stress: Not on file  Social Connections: Not on file    Allergies: Allergies  Allergen Reactions   Codeine Rash   Latex Rash    Outpatient Meds: Current Outpatient Medications  Medication Sig Dispense Refill   budesonide-formoterol (SYMBICORT) 80-4.5 MCG/ACT inhaler SMARTSIG:2 Puff(s) By Mouth Twice Daily     levothyroxine (SYNTHROID) 88 MCG tablet Take 88 mcg by mouth daily.     simvastatin (ZOCOR) 20 MG tablet Take 20 mg by mouth daily.      No current facility-administered medications for this visit.      ___________________________________________________________________ Objective   Exam:  BP 130/78 (BP Location: Left Arm, Patient Position: Sitting, Cuff Size: Normal)   Pulse 93   Ht 5' 1.5" (1.562 m)   Wt 137 lb 4 oz (62.3 kg)   BMI 25.51 kg/m  Wt Readings from Last 3 Encounters:  07/02/22 137 lb 4 oz (62.3 kg)  01/02/21 145 lb (65.8 kg)    General: Very pleasant, well-appearing Eyes: sclera anicteric, no redness ENT: oral mucosa moist without lesions, no cervical or supraclavicular lymphadenopathy CV: Regular without appreciable murmur, no JVD, no peripheral edema Resp: clear to auscultation bilaterally, normal RR and effort noted GI: soft, no tenderness, with active bowel sounds. No guarding or palpable organomegaly noted.  No bulging flanks, distention, caput medusa  Skin; warm and dry, no rash or jaundice noted.  No spider nevi Neuro: awake, alert and oriented x 3. Normal gross motor function and fluent speech Steady gait Labs:  Labs from primary care visit noted above  Radiologic Studies:  No scan reports were available to [place] from the referral or PCP records, but I was able to enter thePACS system through the report of the plain x-ray this patient had of her foot, and thus able to find the CT scan report and images below   CLINICAL DATA: 77 year old female with history of lower abdominal pain for the past 2 days.  EXAM: CT ABDOMEN AND PELVIS WITH CONTRAST  TECHNIQUE: Multidetector CT imaging of the abdomen and pelvis was performed using the standard protocol following bolus administration of intravenous contrast.  RADIATION DOSE REDUCTION: This exam was performed according to the departmental dose-optimization program which includes automated exposure control, adjustment of the mA and/or kV according to patient size and/or use of iterative reconstruction technique.  CONTRAST: 80 mL of  Isovue 370.  COMPARISON: CT the abdomen and pelvis 12/11/2020.  FINDINGS: Lower chest: Atherosclerotic calcifications in the descending thoracic aorta. Small hiatal hernia.  Hepatobiliary: Liver has a nodular contour, indicative of underlying cirrhosis. No suspicious cystic or solid hepatic lesions. No intra or extrahepatic biliary ductal dilatation. Status post cholecystectomy.  Pancreas: No pancreatic mass. No pancreatic ductal dilatation. No pancreatic or peripancreatic fluid collections or inflammatory changes.  Spleen: Unremarkable.  Adrenals/Urinary Tract: Low-attenuation lesions in both kidneys, compatible with simple cysts (Bosniak class 1), largest of which is exophytic extending off the medial aspect of the interpolar region of the left kidney measuring up to 4.6 cm in diameter (no imaging follow-up is recommended). No suspicious renal lesions. No hydroureteronephrosis. Urinary bladder is unremarkable in appearance..  Stomach/Bowel: The appearance of the stomach is normal. No pathologic dilatation of small bowel or colon. Numerous colonic diverticulae are noted. In the proximal sigmoid colon there is extensive mural thickening and extensive surrounding inflammatory changes associated with these diverticulae are,  indicative of severe acute diverticulitis. No discrete diverticular abscess is noted. No definitive findings to indicate frank perforation confidently identified at this time. Normal appendix.  Vascular/Lymphatic: Aortic atherosclerosis, without evidence of aneurysm or dissection in the abdominal or pelvic vasculature. No lymphadenopathy noted in the abdomen or pelvis.  Reproductive: Status post hysterectomy. Ovaries are not confidently identified may be surgically absent or atrophic.  Other: Trace volume of ascites in the low anatomic pelvis, most evident along the left pelvic sidewall, presumably reactive. No larger volume of ascites. No  pneumoperitoneum.  Musculoskeletal: There are no aggressive appearing lytic or blastic lesions noted in the visualized portions of the skeleton.  IMPRESSION: 1. Severe diverticulitis of the proximal sigmoid colon. No definite diverticular abscess or signs of frank perforation are noted at this time. 2. Aortic atherosclerosis. 3. Morphologic changes in the liver suggesting early cirrhosis. 4. Additional incidental findings, as above.   Electronically Signed By: Vinnie Langton M.D. On: 12/10/2021 06:58    (Images personally reviewed - H. Danis) Assessment: Encounter Diagnoses  Name Primary?   Abnormal finding on GI tract imaging Yes   History of diverticulitis of colon     The reported hepatic finding on scan must be quite subtle as I personally do not appreciate that on my review.  There is no apparent abnormality of the parenchyma, no caudate hypertrophy, widening of the fissures or evidence of portal hypertension to suggest advanced liver disease. She has no physical exam evidence of portal hypertension/cirrhosis and her last known platelet count was normal.  No LFTs on file.  She also has no personal or family history of liver disease or risk factors for cirrhosis.  In summary, I do not think this patient has cirrhosis.  My only planned further workup is a set of LFTs to have on file in case she needs to see [place] again in the future.  Thank you for the courtesy of this consult.  Please call me with any questions or concerns.  Nelida Meuse III  CC: Referring provider noted above

## 2022-07-02 NOTE — Patient Instructions (Signed)
_______________________________________________________  If your blood pressure at your visit was 140/90 or greater, please contact your primary care physician to follow up on this.  _______________________________________________________  If you are age 76 or older, your body mass index should be between 23-30. Your Body mass index is 25.51 kg/m. If this is out of the aforementioned range listed, please consider follow up with your Primary Care Provider.  If you are age 59 or younger, your body mass index should be between 19-25. Your Body mass index is 25.51 kg/m. If this is out of the aformentioned range listed, please consider follow up with your Primary Care Provider.   ________________________________________________________  The [facility] providers would like to encourage you to use [facility] to communicate with providers for non-urgent requests or questions.  Due to long hold times on the telephone, sending your provider a message by [facility] may be a faster and more efficient way to get a response.  Please allow 48 business hours for a response.  Please remember that this is for non-urgent requests.  _______________________________________________________  Your provider has requested that you go to the basement level for lab work before leaving today. Press "B" on the elevator. The lab is located at the first door on the left as you exit the elevator.  Due to recent changes in healthcare laws, you may see the results of your imaging and laboratory studies on MyChart before your provider has had a chance to review them.  We understand that in some cases there may be results that are confusing or concerning to you. Not all laboratory results come back in the same time frame and the provider may be waiting for multiple results in order to interpret others.  Please give [place] 48 hours in order for your provider to thoroughly review all the results before contacting the office for clarification of  your results.    It was a pleasure to see you today!  Thank you for trusting me with your gastrointestinal care!

## 2022-09-25 DIAGNOSIS — H401131 Primary open-angle glaucoma, bilateral, mild stage: Secondary | ICD-10-CM | POA: Diagnosis not present

## 2022-09-30 DIAGNOSIS — M79661 Pain in right lower leg: Secondary | ICD-10-CM | POA: Diagnosis not present

## 2022-09-30 DIAGNOSIS — S99922A Unspecified injury of left foot, initial encounter: Secondary | ICD-10-CM | POA: Diagnosis not present

## 2022-09-30 DIAGNOSIS — M19072 Primary osteoarthritis, left ankle and foot: Secondary | ICD-10-CM | POA: Diagnosis not present

## 2022-09-30 DIAGNOSIS — M7989 Other specified soft tissue disorders: Secondary | ICD-10-CM | POA: Diagnosis not present

## 2022-10-14 DIAGNOSIS — Z1231 Encounter for screening mammogram for malignant neoplasm of breast: Secondary | ICD-10-CM | POA: Diagnosis not present

## 2022-12-12 DIAGNOSIS — R319 Hematuria, unspecified: Secondary | ICD-10-CM | POA: Diagnosis not present

## 2022-12-12 DIAGNOSIS — R809 Proteinuria, unspecified: Secondary | ICD-10-CM | POA: Diagnosis not present

## 2022-12-12 DIAGNOSIS — R300 Dysuria: Secondary | ICD-10-CM | POA: Diagnosis not present

## 2022-12-12 DIAGNOSIS — J309 Allergic rhinitis, unspecified: Secondary | ICD-10-CM | POA: Diagnosis not present

## 2023-01-20 ENCOUNTER — Ambulatory Visit (INDEPENDENT_AMBULATORY_CARE_PROVIDER_SITE_OTHER): Payer: Medicare Other | Admitting: Podiatry

## 2023-01-20 DIAGNOSIS — D2371 Other benign neoplasm of skin of right lower limb, including hip: Secondary | ICD-10-CM | POA: Diagnosis not present

## 2023-01-20 NOTE — Progress Notes (Signed)
       Subjective:  Patient ID: Jodi Huber, female    DOB: [birth year],  MRN: [id number]  Jodi Huber presents to clinic today for:  Chief Complaint  Patient presents with   Callouses    Callus trim to right ball of foot. Painful. Not diabetic.    Patient has recurrent focal hyperkeratotic lesions on the ball of right foot.  States that they are pretty tender today.  As she notes that they grow back fairly quickly after they have been shaved.  PCP is Jodi Ponto, MD.  Past Medical History:  Diagnosis Date   Diverticulosis 12/10/2020   Gallstone    Glaucoma    Kidney stones 1978   Skin cancer 2007   Thyroid disease     Past Surgical History:  Procedure Laterality Date   CHOLECYSTECTOMY     HERNIA REPAIR     HYSTEROTOMY      Allergies  Allergen Reactions   Codeine Rash   Latex Rash    Objective:  There were no vitals filed for this visit.  Jodi Huber is a pleasant 77 y.o. female in NAD. AAO x 3.  Vascular Examination: Capillary refill time is 3-5 seconds to toes bilateral. Palpable pedal pulses b/l LE. Digital hair present b/l. No pedal edema b/l. Skin temperature gradient WNL b/l. No varicosities b/l. No cyanosis or clubbing noted b/l.   Dermatological Examination: Pedal skin with normal turgor, texture and tone b/l. No open wounds. No interdigital macerations b/l.   Focal hyperkeratotic lesion x 2 present, with pain on palpation, located submet 2 right foot.  No surrounding erythema is noted  Assessment/Plan: 1. Benign neoplasm of skin of right foot     The hyperkeratotic lesions were sharply debrided/shaved with sterile #313 blade.  With the patient's verbal consent these were treated with Cantharone solution.  She was informed that she may experience some blistering tomorrow to the area.  This is an attempt to try to resolve these and help prevent them from growing back, or at least growing back so quickly.  She can remove the Band-Aid that was placed  over the area post treatment and approximately 5 to 6 hours.  She will follow-up as needed   Clerance Lav, DPM, FACFAS [facility]     [address] [place], [place] [address]                Office [phone]  Fax [phone]

## 2023-02-04 DIAGNOSIS — E039 Hypothyroidism, unspecified: Secondary | ICD-10-CM | POA: Diagnosis not present

## 2023-02-04 DIAGNOSIS — M8580 Other specified disorders of bone density and structure, unspecified site: Secondary | ICD-10-CM | POA: Diagnosis not present

## 2023-02-04 DIAGNOSIS — Z0000 Encounter for general adult medical examination without abnormal findings: Secondary | ICD-10-CM | POA: Diagnosis not present

## 2023-02-04 DIAGNOSIS — E785 Hyperlipidemia, unspecified: Secondary | ICD-10-CM | POA: Diagnosis not present

## 2023-02-04 DIAGNOSIS — Z1339 Encounter for screening examination for other mental health and behavioral disorders: Secondary | ICD-10-CM | POA: Diagnosis not present

## 2023-02-04 DIAGNOSIS — Z6824 Body mass index (BMI) 24.0-24.9, adult: Secondary | ICD-10-CM | POA: Diagnosis not present

## 2023-02-04 DIAGNOSIS — E559 Vitamin D deficiency, unspecified: Secondary | ICD-10-CM | POA: Diagnosis not present

## 2023-02-04 DIAGNOSIS — Z79899 Other long term (current) drug therapy: Secondary | ICD-10-CM | POA: Diagnosis not present

## 2023-03-17 DIAGNOSIS — Z23 Encounter for immunization: Secondary | ICD-10-CM | POA: Diagnosis not present

## 2023-04-05 DIAGNOSIS — Z6825 Body mass index (BMI) 25.0-25.9, adult: Secondary | ICD-10-CM | POA: Diagnosis not present

## 2023-04-05 DIAGNOSIS — B372 Candidiasis of skin and nail: Secondary | ICD-10-CM | POA: Diagnosis not present

## 2023-05-21 DIAGNOSIS — Z961 Presence of intraocular lens: Secondary | ICD-10-CM | POA: Diagnosis not present

## 2023-05-24 ENCOUNTER — Encounter: Payer: Self-pay | Admitting: Podiatry

## 2023-05-24 ENCOUNTER — Ambulatory Visit (INDEPENDENT_AMBULATORY_CARE_PROVIDER_SITE_OTHER): Payer: Medicare Other | Admitting: Podiatry

## 2023-05-24 DIAGNOSIS — D2372 Other benign neoplasm of skin of left lower limb, including hip: Secondary | ICD-10-CM | POA: Diagnosis not present

## 2023-05-24 DIAGNOSIS — D2371 Other benign neoplasm of skin of right lower limb, including hip: Secondary | ICD-10-CM | POA: Diagnosis not present

## 2023-05-24 NOTE — Patient Instructions (Signed)
Take dressing off in 8 hours and wash the foot with soap and water. If it is hurting or becomes uncomfortable before the 8 hours, go ahead and remove the bandage and wash the area.  If it blisters, apply antibiotic ointment and a band-aid.  Monitor for any signs/symptoms of infection. Call the office immediately if any occur or go directly to the emergency room. Call with any questions/concerns.   

## 2023-05-24 NOTE — Progress Notes (Signed)
       Subjective:  Patient ID: Jodi Huber, female    DOB: 1945-08-23,  MRN: [id number]  Jodi Huber presents to clinic today for:  Chief Complaint  Patient presents with   Foot Pain    Rt Callus on bottom of foot - returning.  Has sore/bump on outside of Lt foot   Patient has recurrent focal hyperkeratotic lesions on the ball of right foot.  She also comes in with similar appearing lesion to the left foot fifth met base plantarly.  States that they are pretty tender today.  As she notes that they grow back fairly quickly after they have been shaved.  PCP is Jodi Ponto, MD.  Past Medical History:  Diagnosis Date   Diverticulosis 12/10/2020   Gallstone    Glaucoma    Kidney stones 1978   Skin cancer 2007   Thyroid disease     Past Surgical History:  Procedure Laterality Date   CHOLECYSTECTOMY     HERNIA REPAIR     HYSTEROTOMY      Allergies  Allergen Reactions   Codeine Rash   Latex Rash    Objective:  There were no vitals filed for this visit.  Jodi Huber is a pleasant 77 y.o. female in NAD. AAO x 3.  Vascular Examination: Capillary refill time is 3-5 seconds to toes bilateral. Palpable pedal pulses b/l LE. Digital hair present b/l. No pedal edema b/l. Skin temperature gradient WNL b/l. No varicosities b/l. No cyanosis or clubbing noted b/l.   Dermatological Examination: Pedal skin with normal turgor, texture and tone b/l. No open wounds. No interdigital macerations b/l.   Focal hyperkeratotic lesion x 2 present, with pain on palpation, located submet 2 right foot.  Left foot there is similar appearing focal hyperkeratotic lesion with nucleated core present to the subfifth metatarsal base.  No surrounding erythema is noted  Assessment/Plan: 1. Benign neoplasm of skin of right foot   2. Benign neoplasm of skin of left foot     The hyperkeratotic lesions were sharply debrided/shaved with sterile #313 blade.  With the patient's verbal consent these were  treated with Cantharone solution.  She was informed that she may experience some blistering tomorrow to the area.  This is an attempt to try to resolve these and help prevent them from growing back, or at least growing back so quickly.  She can remove the Band-Aid that was placed over the area post treatment and approximately 6-8 hours.  Offloading horseshoe pads dispensed and applied bilaterally.  She will follow-up as needed   Jodi Huber, DPM, AACFAS [facility]     [address] [place], [place] [address]                Office [phone]  Fax [phone]

## 2023-07-11 DIAGNOSIS — R350 Frequency of micturition: Secondary | ICD-10-CM | POA: Diagnosis not present

## 2023-07-11 DIAGNOSIS — R1084 Generalized abdominal pain: Secondary | ICD-10-CM | POA: Diagnosis not present

## 2023-07-11 DIAGNOSIS — R300 Dysuria: Secondary | ICD-10-CM | POA: Diagnosis not present

## 2023-10-11 DIAGNOSIS — H401131 Primary open-angle glaucoma, bilateral, mild stage: Secondary | ICD-10-CM | POA: Diagnosis not present

## 2023-10-27 DIAGNOSIS — Z1231 Encounter for screening mammogram for malignant neoplasm of breast: Secondary | ICD-10-CM | POA: Diagnosis not present

## 2023-12-24 ENCOUNTER — Ambulatory Visit: Admitting: Podiatry

## 2024-01-17 ENCOUNTER — Encounter: Payer: Self-pay | Admitting: Podiatry

## 2024-01-17 ENCOUNTER — Ambulatory Visit (INDEPENDENT_AMBULATORY_CARE_PROVIDER_SITE_OTHER): Admitting: Podiatry

## 2024-01-17 DIAGNOSIS — D2371 Other benign neoplasm of skin of right lower limb, including hip: Secondary | ICD-10-CM | POA: Diagnosis not present

## 2024-01-17 NOTE — Progress Notes (Unsigned)
       Subjective:  Patient ID: Jodi Huber, female    DOB: [birth year],  MRN: [id number]  Jodi Huber presents to clinic today for:  Chief Complaint  Patient presents with   Callouses    Right foot callous. Not diabetic and no anti coag.    Patient has recurrent focal hyperkeratotic lesion on the ball of right foot.  She has had this treated previously. Today she notes the lesion is present underneath the right forefoot today and it is tender with pressure and weightbearing.  PCP is Jodi Marcellus RAMAN, MD.  Past Medical History:  Diagnosis Date   Diverticulosis 12/10/2020   Gallstone    Glaucoma    Kidney stones 1978   Skin cancer 2007   Thyroid  disease     Past Surgical History:  Procedure Laterality Date   CHOLECYSTECTOMY     HERNIA REPAIR     HYSTEROTOMY      Allergies  Allergen Reactions   Codeine Rash   Latex Rash    Objective:  There were no vitals filed for this visit.  Jodi Huber is a pleasant 78 y.o. female in NAD. AAO x 3.  Vascular Examination: Capillary refill time is 3-5 seconds to toes bilateral. Palpable pedal pulses b/l LE. Digital hair present b/l. No pedal edema b/l. Skin temperature gradient WNL b/l. No varicosities b/l. No cyanosis or clubbing noted b/l.   Dermatological Examination: Pedal skin with normal turgor, texture and tone b/l. No open wounds. No interdigital macerations b/l.   Focal hyperkeratotic lesion with pain on palpation, located submet 2 right foot.  Nucleated core present, disruption of skin tension lines  No surrounding erythema is noted  Assessment/Plan: 1. Benign neoplasm of skin of right foot     The hyperkeratotic lesion right sub second metatarsal head was sharply debrided/shaved with sterile #313 blade.  With the patient's verbal consent these were treated with salinocaine cream.  Instructed patient to wash off with soapy water in approximately 8 hours.  Band-Aid applied.  Offloading felt padding applied. Home care  instructions with continued offloading discussed with patient   She will follow-up as needed   Jodi Huber, DPM, AACFAS [facility]     [address] [place], [place] [address]                Office [phone]  Fax [phone]

## 2024-03-03 DIAGNOSIS — Z23 Encounter for immunization: Secondary | ICD-10-CM | POA: Diagnosis not present

## 2024-03-07 DIAGNOSIS — E039 Hypothyroidism, unspecified: Secondary | ICD-10-CM | POA: Diagnosis not present

## 2024-03-07 DIAGNOSIS — Z79899 Other long term (current) drug therapy: Secondary | ICD-10-CM | POA: Diagnosis not present

## 2024-03-07 DIAGNOSIS — Z1331 Encounter for screening for depression: Secondary | ICD-10-CM | POA: Diagnosis not present

## 2024-03-07 DIAGNOSIS — Z0000 Encounter for general adult medical examination without abnormal findings: Secondary | ICD-10-CM | POA: Diagnosis not present

## 2024-03-07 DIAGNOSIS — E785 Hyperlipidemia, unspecified: Secondary | ICD-10-CM | POA: Diagnosis not present

## 2024-03-07 DIAGNOSIS — Z1339 Encounter for screening examination for other mental health and behavioral disorders: Secondary | ICD-10-CM | POA: Diagnosis not present
